# Patient Record
Sex: Female | Born: 1990 | Race: Black or African American | Hispanic: No | Marital: Married | Smoking: Never smoker
Health system: Southern US, Community
[De-identification: ages and names within clinical notes are randomized; demographics above are authoritative.]

---

## 2019-03-05 ENCOUNTER — Emergency Department (HOSPITAL_COMMUNITY)
Admission: EM | Admit: 2019-03-05 | Discharge: 2019-03-05 | Disposition: A | Discharge status: Home / Self Care | Payer: Medicaid - Out of State | Attending: Emergency Medicine | Admitting: Emergency Medicine

## 2019-03-05 ENCOUNTER — Encounter (HOSPITAL_COMMUNITY): Payer: Self-pay | Admitting: Emergency Medicine

## 2019-03-05 ENCOUNTER — Other Ambulatory Visit: Payer: Self-pay

## 2019-03-05 DIAGNOSIS — L239 Allergic contact dermatitis, unspecified cause: Secondary | ICD-10-CM | POA: Insufficient documentation

## 2019-03-05 DIAGNOSIS — R21 Rash and other nonspecific skin eruption: Secondary | ICD-10-CM | POA: Diagnosis present

## 2019-03-05 MED ORDER — HYDROXYZINE HCL 25 MG PO TABS
25.0000 mg | ORAL_TABLET | Freq: Once | ORAL | Status: AC
  Administered 2019-03-05: 25 mg via ORAL
  Filled 2019-03-05: qty 1

## 2019-03-05 MED ORDER — HYDROXYZINE HCL 25 MG PO TABS: 25.0000 mg | ORAL_TABLET | Freq: Four times a day (QID) | ORAL | 0 refills | Status: AC | PRN

## 2019-03-05 NOTE — ED Triage Notes (Signed)
Pt reports wore a new bra yesterday got from [place] without washing and having rash.

## 2019-03-05 NOTE — ED Provider Notes (Signed)
[facility]-EMERGENCY DEPT Provider Note   CSN: [id number] Arrival date & time: 03/05/19  1424     History   Chief Complaint Chief Complaint  Patient presents with  . Rash    HPI Alexa Allison is a 28 y.o. female.     Patient is a 28 year old female with no past medical history presenting to the emergency department for rash which began yesterday.  Patient reports that this began almost immediately after she put on a new bra that she had just opened from the package that was shipped in the mail.  Reports that it is very itchy and it got worse overnight.  Reports that she has the rash around her torso where her breasts are as well as her shoulders where the straps of the bra were.  Denies any fever, chills, nausea, vomiting, pain     History reviewed. No pertinent past medical history.  There are no active problems to display for this patient.      OB History   No obstetric history on file.      Home Medications    Prior to Admission medications   Medication Sig Start Date End Date Taking? Authorizing Provider  hydrOXYzine (ATARAX/VISTARIL) 25 MG tablet Take 1 tablet (25 mg total) by mouth every 6 (six) hours as needed for up to 7 days. 03/05/19 03/12/19  Arlyn Dunning, PA-C    Family History No family history on file.  Social History Social History   Tobacco Use  . Smoking status: Not on file  Substance Use Topics  . Alcohol use: Not on file  . Drug use: Not on file     Allergies   Patient has no known allergies.   Review of Systems Review of Systems  Constitutional: Negative for appetite change, chills and fever.  HENT: Negative for congestion and sore throat.   Eyes: Negative for redness.  Respiratory: Negative for shortness of breath.   Cardiovascular: Negative for chest pain.  Gastrointestinal: Negative for abdominal pain, nausea and vomiting.  Genitourinary: Negative for dysuria.  Musculoskeletal: Negative for arthralgias,  back pain and myalgias.  Skin: Positive for rash.  Allergic/Immunologic: Negative for immunocompromised state.  Neurological: Negative for dizziness, light-headedness and headaches.     Physical Exam Updated Vital Signs BP (!) 142/94   Pulse 95   Temp 98.3 F (36.8 C) (Oral)   Resp 17   LMP 02/14/2019   SpO2 100%   Physical Exam Vitals signs and nursing note reviewed.  Constitutional:      Appearance: Normal appearance.  HENT:     Head: Normocephalic.  Eyes:     Conjunctiva/sclera: Conjunctivae normal.  Pulmonary:     Effort: Pulmonary effort is normal.  Chest:       Comments: Patient has a scattered, hive-like appearing rash which is blanchable over the folds of the breast and shoulders.  This extends onto the upper extremities to the forearms, as well as the back. Skin:    General: Skin is dry.  Neurological:     Mental Status: She is alert.  Psychiatric:        Mood and Affect: Mood normal.      ED Treatments / Results  Labs (all labs ordered are listed, but only abnormal results are displayed) Labs Reviewed - No data to display  EKG None  Radiology No results found.  Procedures Procedures (including critical care time)  Medications Ordered in ED Medications  hydrOXYzine (ATARAX/VISTARIL) tablet 25 mg (has no  administration in time range)     Initial Impression / Assessment and Plan / ED Course  I have reviewed the triage vital signs and the nursing notes.  Pertinent labs & imaging results that were available during my care of the patient were reviewed by me and considered in my medical decision making (see chart for details).       Based on review of vitals, medical screening exam, lab work and/or imaging, there does not appear to be an acute, emergent etiology for the patient's symptoms. Counseled pt on good return precautions and encouraged both PCP and ED follow-up as needed.  Prior to discharge, I also discussed incidental imaging  findings with patient in detail and advised appropriate, recommended follow-up in detail.  Clinical Impression: 1. Allergic contact dermatitis, unspecified trigger     Disposition: Discharge  Prior to providing a prescription for a controlled substance, I independently reviewed the patient's recent prescription history on the [facility]. The patient had no recent or regular prescriptions and was deemed appropriate for a brief, less than 3 day prescription of narcotic for acute analgesia.  This note was prepared with assistance of Systems analyst. Occasional wrong-word or sound-a-like substitutions may have occurred due to the inherent limitations of voice recognition software.   Final Clinical Impressions(s) / ED Diagnoses   Final diagnoses:  Allergic contact dermatitis, unspecified trigger    ED Discharge Orders         Ordered    hydrOXYzine (ATARAX/VISTARIL) 25 MG tablet  Every 6 hours PRN     03/05/19 1530           Kristine Royal 03/05/19 1530    Lacretia Leigh, MD 03/07/19 1342

## 2019-03-05 NOTE — Discharge Instructions (Signed)
Patient not to wear this again.  Take medication as prescribed.  If the rash is persists or worsens please follow-up with dermatology.  If you have any new symptoms please return to the emergency department.

## 2020-10-12 ENCOUNTER — Telehealth: Payer: Self-pay

## 2020-10-12 NOTE — Telephone Encounter (Signed)
Name does not ring a bell. Please call and clarify who she is? Does she have a family member who I care for? Because I am not open to new patients.

## 2020-10-12 NOTE — Telephone Encounter (Signed)
Please advise 

## 2020-10-12 NOTE — Telephone Encounter (Signed)
patient stated that dr.andy told patient to call and get a new patient apt with her please advise ?

## 2020-10-13 NOTE — Telephone Encounter (Signed)
Unfortunately I am currently closed to new patients because my panel is full.  This could change in the future.

## 2020-10-13 NOTE — Telephone Encounter (Signed)
Patient notified

## 2020-10-13 NOTE — Telephone Encounter (Signed)
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 Mardelle Matte.

## 2020-10-13 NOTE — Telephone Encounter (Signed)
Please call patient and offer to est with another provider

## 2020-12-31 ENCOUNTER — Ambulatory Visit: Payer: PRIVATE HEALTH INSURANCE | Admitting: Physician Assistant

## 2020-12-31 ENCOUNTER — Emergency Department (HOSPITAL_COMMUNITY): Payer: Managed Care, Other (non HMO)

## 2020-12-31 ENCOUNTER — Encounter (HOSPITAL_COMMUNITY): Payer: Self-pay

## 2020-12-31 ENCOUNTER — Other Ambulatory Visit: Payer: Self-pay

## 2020-12-31 ENCOUNTER — Emergency Department (HOSPITAL_COMMUNITY)
Admission: EM | Admit: 2020-12-31 | Discharge: 2020-12-31 | Disposition: A | Discharge status: Home / Self Care | Payer: Managed Care, Other (non HMO) | Attending: Emergency Medicine | Admitting: Emergency Medicine

## 2020-12-31 DIAGNOSIS — S92421A Displaced fracture of distal phalanx of right great toe, initial encounter for closed fracture: Secondary | ICD-10-CM | POA: Insufficient documentation

## 2020-12-31 DIAGNOSIS — W208XXA Other cause of strike by thrown, projected or falling object, initial encounter: Secondary | ICD-10-CM | POA: Diagnosis not present

## 2020-12-31 DIAGNOSIS — S99921A Unspecified injury of right foot, initial encounter: Secondary | ICD-10-CM | POA: Diagnosis present

## 2020-12-31 DIAGNOSIS — M79673 Pain in unspecified foot: Secondary | ICD-10-CM

## 2020-12-31 MED ORDER — IBUPROFEN 400 MG PO TABS
600.0000 mg | ORAL_TABLET | Freq: Once | ORAL | Status: AC
  Administered 2020-12-31: 600 mg via ORAL
  Filled 2020-12-31: qty 1

## 2020-12-31 NOTE — ED Provider Notes (Signed)
[facility] EMERGENCY DEPARTMENT Provider Note   CSN: [id number] Arrival date & time: 12/31/20  0450     History Chief Complaint  Patient presents with   Foot Injury    Alexa Allison is a 30 y.o. female.  HPI 30 year old female presents with right great toe injury.  Yesterday afternoon she accidentally dropped a weight on her foot.  Has taken Tylenol.  She has pain mostly with dorsiflexion of the toe.  No numbness.  She has been able to ambulate.  History reviewed. No pertinent past medical history.  There are no problems to display for this patient.   History reviewed. No pertinent surgical history.   OB History   No obstetric history on file.     History reviewed. No pertinent family history.  Social History   Tobacco Use   Smoking status: Never   Smokeless tobacco: Never  Vaping Use   Vaping Use: Never used  Substance Use Topics   Alcohol use: Yes    Comment: occasionally   Drug use: Never    Home Medications Prior to Admission medications   Not on File    Allergies    Patient has no known allergies.  Review of Systems   Review of Systems  Musculoskeletal:  Positive for arthralgias.  Neurological:  Negative for weakness and numbness.   Physical Exam Updated Vital Signs BP (!) 140/109 (BP Location: Right Arm)   Pulse (!) 103   Temp 98.5 F (36.9 C) (Oral)   Resp 18   Ht 5\' 4"  (1.626 m)   Wt 86.2 kg   SpO2 99%   BMI 32.61 kg/m   Physical Exam Vitals and nursing note reviewed.  Constitutional:      Appearance: She is well-developed.  HENT:     Head: Normocephalic and atraumatic.     Right Ear: External ear normal.     Left Ear: External ear normal.     Nose: Nose normal.  Eyes:     General:        Right eye: No discharge.        Left eye: No discharge.  Cardiovascular:     Rate and Rhythm: Normal rate and regular rhythm.     Pulses:          Dorsalis pedis pulses are 2+ on the right side.  Pulmonary:     Effort:  Pulmonary effort is normal.  Abdominal:     General: There is no distension.  Musculoskeletal:     Right foot: Decreased range of motion (great toe). Tenderness present.       Feet:  Skin:    General: Skin is warm and dry.  Neurological:     Mental Status: She is alert.  Psychiatric:        Mood and Affect: Mood is not anxious.    ED Results / Procedures / Treatments   Labs (all labs ordered are listed, but only abnormal results are displayed) Labs Reviewed - No data to display  EKG None  Radiology DG Foot Complete Right  Result Date: 12/31/2020 CLINICAL DATA:  30 year old female with history of trauma to the right great toe (dropped a 45 pound weight onto the right great toe). EXAM: RIGHT FOOT COMPLETE - 3+ VIEW COMPARISON:  No priors. FINDINGS: Best appreciated on the lateral projection is a mildly displaced intra-articular fracture through the base of the first distal phalanx, with the distal aspect of the fracture demonstrating approximately 3 mm of dorsal displacement (  although the proximal intra-articular portion of the fracture does not appear displaced). Overlying soft tissues are mildly swollen. There is no evidence of arthropathy or other focal bone abnormality. Soft tissues are unremarkable. IMPRESSION: 1. Acute minimally displaced intra-articular fracture through the base of the distal phalanx of the right great toe, as above. Electronically Signed   By: Trudie Reed M.D.   On: 12/31/2020 05:35    Procedures Procedures   Medications Ordered in ED Medications  ibuprofen (ADVIL) tablet 600 mg (has no administration in time range)    ED Course  I have reviewed the triage vital signs and the nursing notes.  Pertinent labs & imaging results that were available during my care of the patient were reviewed by me and considered in my medical decision making (see chart for details).    MDM Rules/Calculators/A&P                           Patient is neurovascularly  intact.  She does not have a subungual hematoma.  She has a minimally displaced fracture and will have her follow-up with orthopedics.  She declines anything stronger than ibuprofen/Tylenol for pain at home.  Will buddy tape. Final Clinical Impression(s) / ED Diagnoses Final diagnoses:  Foot pain  Closed displaced fracture of distal phalanx of right great toe, initial encounter    Rx / DC Orders ED Discharge Orders     None        Pricilla Loveless, MD 12/31/20 [phone]

## 2020-12-31 NOTE — ED Triage Notes (Signed)
Pt dropped a 45lb weight onto right great toe. Since then pain has increased. Pt states she has applied ice and taken tylenol w/o relief. Pt able to move toes, has pain.

## 2021-06-01 ENCOUNTER — Encounter (HOSPITAL_COMMUNITY): Payer: Self-pay | Admitting: Emergency Medicine

## 2021-06-01 ENCOUNTER — Emergency Department (HOSPITAL_COMMUNITY): Payer: Managed Care, Other (non HMO)

## 2021-06-01 ENCOUNTER — Emergency Department (HOSPITAL_COMMUNITY)
Admission: EM | Admit: 2021-06-01 | Discharge: 2021-06-01 | Disposition: A | Discharge status: Home / Self Care | Payer: Managed Care, Other (non HMO) | Attending: Emergency Medicine | Admitting: Emergency Medicine

## 2021-06-01 DIAGNOSIS — M25511 Pain in right shoulder: Secondary | ICD-10-CM | POA: Diagnosis not present

## 2021-06-01 DIAGNOSIS — Y92410 Unspecified street and highway as the place of occurrence of the external cause: Secondary | ICD-10-CM | POA: Diagnosis not present

## 2021-06-01 DIAGNOSIS — M545 Low back pain, unspecified: Secondary | ICD-10-CM | POA: Diagnosis present

## 2021-06-01 DIAGNOSIS — Z79899 Other long term (current) drug therapy: Secondary | ICD-10-CM | POA: Diagnosis not present

## 2021-06-01 DIAGNOSIS — M7918 Myalgia, other site: Secondary | ICD-10-CM

## 2021-06-01 DIAGNOSIS — S39012A Strain of muscle, fascia and tendon of lower back, initial encounter: Secondary | ICD-10-CM | POA: Insufficient documentation

## 2021-06-01 LAB — I-STAT BETA HCG BLOOD, ED (MC, WL, AP ONLY): I-stat hCG, quantitative: <5 m[IU]/mL (ref <5)

## 2021-06-01 MED ORDER — CYCLOBENZAPRINE HCL 10 MG PO TABS: 10.0000 mg | ORAL_TABLET | Freq: Two times a day (BID) | ORAL | 0 refills | Status: AC | PRN
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
## 2021-06-01 NOTE — Discharge Instructions (Signed)
Recommend 600 mg ibuprofen every 8 hours as needed for pain.  Recommend 1000 mg of Tylenol every 6 hours as needed for pain.  Take Flexeril as prescribed for muscle spasms.  This medication is sedating so do not use while driving or doing any other dangerous activities as discussed.

## 2022-06-29 ENCOUNTER — Emergency Department (HOSPITAL_COMMUNITY)
Admission: EM | Admit: 2022-06-29 | Discharge: 2022-06-30 | Disposition: A | Discharge status: Home / Self Care | Payer: Managed Care, Other (non HMO) | Attending: Emergency Medicine | Admitting: Emergency Medicine

## 2022-06-29 ENCOUNTER — Other Ambulatory Visit: Payer: Self-pay

## 2022-06-29 DIAGNOSIS — Z91010 Allergy to peanuts: Secondary | ICD-10-CM | POA: Diagnosis not present

## 2022-06-29 DIAGNOSIS — N939 Abnormal uterine and vaginal bleeding, unspecified: Secondary | ICD-10-CM | POA: Insufficient documentation

## 2022-06-29 DIAGNOSIS — D649 Anemia, unspecified: Secondary | ICD-10-CM | POA: Insufficient documentation

## 2022-06-29 LAB — CBC
HCT: 35.6 % — ABNORMAL LOW (ref 36.0–46.0)
Hemoglobin: 11.7 g/dL — ABNORMAL LOW (ref 12.0–15.0)
MCH: 28.1 pg (ref 26.0–34.0)
MCHC: 32.9 g/dL (ref 30.0–36.0)
MCV: 85.4 fL (ref 80.0–100.0)
Platelets: 299 10*3/uL (ref 150–400)
RBC: 4.17 MIL/uL (ref 3.87–5.11)
RDW: 12.7 % (ref 11.5–15.5)
WBC: 5.8 10*3/uL (ref 4.0–10.5)
nRBC: 0 % (ref 0.0–0.2)

## 2022-06-29 LAB — URINALYSIS, MICROSCOPIC (REFLEX): RBC / HPF: >50 RBC/hpf (ref 0–5)

## 2022-06-29 LAB — BASIC METABOLIC PANEL
Anion gap: 6 (ref 5–15)
BUN: 12 mg/dL (ref 6–20)
CO2: 26 mmol/L (ref 22–32)
Calcium: 9.1 mg/dL (ref 8.9–10.3)
Chloride: 105 mmol/L (ref 98–111)
Creatinine, Ser: 0.63 mg/dL (ref 0.44–1.00)
GFR, Estimated: >60 mL/min (ref >60)
Glucose, Bld: 114 mg/dL — ABNORMAL HIGH (ref 70–99)
Potassium: 3.4 mmol/L — ABNORMAL LOW (ref 3.5–5.1)
Sodium: 137 mmol/L (ref 135–145)

## 2022-06-29 LAB — URINALYSIS, ROUTINE W REFLEX MICROSCOPIC

## 2022-06-29 LAB — PREGNANCY, URINE: Preg Test, Ur: NEGATIVE

## 2022-06-29 NOTE — ED Provider Triage Note (Signed)
Emergency Medicine Provider Triage Evaluation Note  Alexa Allison , a 32 y.o. female  was evaluated in triage.  Pt complains of abnormal uterine bleeding.  Patient reports that her period ended 2 weeks ago.  Patient states that beginning yesterday she developed vaginal bleeding.  Patient states she is going through 3-4 pads per hour.  Patient also endorsing dysuria.  Patient denies vaginal discharge, flank pain, lightheadedness, dizziness, weakness, shortness of breath.  Patient reports she has IUD in place, has never experienced abnormal uterine bleeding before.  The patient reports that she was recently sexually active with her fianc, denies any dyspareunia.  Review of Systems  Positive:  Negative:   Physical Exam  BP (!) 139/91 (BP Location: Right Arm)   Pulse 95   Temp 98.9 F (37.2 C) (Oral)   Resp 16   LMP 06/28/2022 (Exact Date)   SpO2 99%  Gen:   Awake, no distress   Resp:  Normal effort  MSK:   Moves extremities without difficulty  Other:    Medical Decision Making  Medically screening exam initiated at 5:07 PM.  Appropriate orders placed.  Alexa Allison was informed that the remainder of the evaluation will be completed by another provider, this initial triage assessment does not replace that evaluation, and the importance of remaining in the ED until their evaluation is complete.     Azucena Cecil, PA-C 06/29/22 1708

## 2022-06-29 NOTE — ED Triage Notes (Signed)
Pt reports a normal period two weeks ago and then began having vaginal bleeding yesterday again. Bleeding heavier than usual but pain less severe. Has IUD, normally does not have irregular periods.

## 2022-06-29 NOTE — ED Provider Notes (Signed)
[facility] Provider Note  CSN: [id number] Arrival date & time: 06/29/22 1646  Chief Complaint(s) Vaginal Bleeding  HPI Alexa Allison is a 32 y.o. female who presents to the emergency department with abnormal vaginal bleeding.  She reports that her previous menstrual cycle was 2 weeks ago.  She began bleeding 2 days ago stating that she went through a pad an hour yesterday.  Today her bleeding has slowed down and has only changed her pad 4 times.  She endorses abdominal discomfort and cramping but denies any current pain at this time.  She denies any urinary symptoms.  No other physical complaints.  Patient is monogamous with her partner.  Denied any vaginal discharge or concern for STD.  She did report having an IUD in place for 5 to 6 years.  The history is provided by the patient.    Past Medical History No past medical history on file. There are no problems to display for this patient.  Home Medication(s) Prior to Admission medications   Medication Sig Start Date End Date Taking? Authorizing Provider  cyclobenzaprine (FLEXERIL) 10 MG tablet Take 1 tablet (10 mg total) by mouth 2 (two) times daily as needed for muscle spasms. 06/01/21   Curatolo, Adam, DO                                                                                                                                    Allergies Peanut-containing drug products  Review of Systems Review of Systems As noted in HPI  Physical Exam Vital Signs  I have reviewed the triage vital signs BP (!) 133/94   Pulse 78   Temp 98.5 F (36.9 C)   Resp 16   LMP 06/28/2022 (Exact Date)   SpO2 99%   Physical Exam Vitals reviewed.  Constitutional:      General: She is not in acute distress.    Appearance: She is well-developed. She is not diaphoretic.  HENT:     Head: Normocephalic and atraumatic.     Right Ear: External ear normal.     Left Ear: External ear normal.     Nose:  Nose normal.  Eyes:     General: No scleral icterus.    Conjunctiva/sclera: Conjunctivae normal.  Neck:     Trachea: Phonation normal.  Cardiovascular:     Rate and Rhythm: Normal rate and regular rhythm.  Pulmonary:     Effort: Pulmonary effort is normal. No respiratory distress.     Breath sounds: No stridor.  Abdominal:     General: There is no distension.     Tenderness: There is no abdominal tenderness.  Musculoskeletal:        General: Normal range of motion.     Cervical back: Normal range of motion.  Neurological:     Mental Status: She is alert and oriented to person, place, and time.  Psychiatric:  Behavior: Behavior normal.     ED Results and Treatments Labs (all labs ordered are listed, but only abnormal results are displayed) Labs Reviewed  URINALYSIS, ROUTINE W REFLEX MICROSCOPIC - Abnormal; Notable for the following components:      Result Value   Color, Urine BROWN (*)    APPearance TURBID (*)    Glucose, UA   (*)    Value: TEST NOT REPORTED DUE TO COLOR INTERFERENCE OF URINE PIGMENT   Hgb urine dipstick   (*)    Value: TEST NOT REPORTED DUE TO COLOR INTERFERENCE OF URINE PIGMENT   Bilirubin Urine   (*)    Value: TEST NOT REPORTED DUE TO COLOR INTERFERENCE OF URINE PIGMENT   Ketones, ur   (*)    Value: TEST NOT REPORTED DUE TO COLOR INTERFERENCE OF URINE PIGMENT   Protein, ur   (*)    Value: TEST NOT REPORTED DUE TO COLOR INTERFERENCE OF URINE PIGMENT   Nitrite   (*)    Value: TEST NOT REPORTED DUE TO COLOR INTERFERENCE OF URINE PIGMENT   Leukocytes,Ua   (*)    Value: TEST NOT REPORTED DUE TO COLOR INTERFERENCE OF URINE PIGMENT   All other components within normal limits  CBC - Abnormal; Notable for the following components:   Hemoglobin 11.7 (*)    HCT 35.6 (*)    All other components within normal limits  BASIC METABOLIC PANEL - Abnormal; Notable for the following components:   Potassium 3.4 (*)    Glucose, Bld 114 (*)    All other  components within normal limits  URINALYSIS, MICROSCOPIC (REFLEX) - Abnormal; Notable for the following components:   Bacteria, UA FEW (*)    All other components within normal limits  PREGNANCY, URINE                                                                                                                         EKG  EKG Interpretation  Date/Time:    Ventricular Rate:    PR Interval:    QRS Duration:   QT Interval:    QTC Calculation:   R Axis:     Text Interpretation:         Radiology No results found.  Medications Ordered in ED Medications - No data to display  Procedures Procedures  (including critical care time)  Medical Decision Making / ED Course   Medical Decision Making Amount and/or Complexity of Data Reviewed Labs: ordered. Decision-making details documented in ED Course.   This patient presents to the ED for concern of vaginal bleeding, this involves an extensive number of treatment options, and is a complaint that carries with it a high risk of complications and morbidity. The differential diagnosis includes but not limited to abnormal uterine bleeding, pregnancy related process, STD buccal feel this is less likely.  Will need to assess for anemia as well.  CBC without leukocytosis.  Mild anemia with a hemoglobin of 11.7.  No priors for comparison. Metabolic panel without significant electrolyte derangements or renal sufficiency. UPT negative. UA is contaminated with blood.  Given her lack of urinary symptoms, low suspicion for UTI. Patient offered STD check in but with shared decision making, patient opted to defer for now.  Bleeding is likely secondary to lifespan of her IUD.  Will provide patient with contact information for OB/GYN        Final Clinical Impression(s) / ED Diagnoses Final diagnoses:  Abnormal  vaginal bleeding   The patient appears reasonably screened and/or stabilized for discharge and I doubt any other medical condition or other [facility] requiring further screening, evaluation, or treatment in the ED at this time. I have discussed the findings, Dx and Tx plan with the patient/family who expressed understanding and agree(s) with the plan. Discharge instructions discussed at length. The patient/family was given strict return precautions who verbalized understanding of the instructions. No further questions at time of discharge.  Disposition: Discharge  Condition: Good  ED Discharge Orders     None        Follow Up: [facility] [place] [phone] Call  to schedule follow up in 1-2 weeks           This chart was dictated using voice recognition software.  Despite best efforts to proofread,  errors can occur which can change the documentation meaning.    Fatima Blank, MD 06/30/22 0001
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
## 2024-02-05 ENCOUNTER — Other Ambulatory Visit: Payer: Self-pay | Admitting: Family Medicine

## 2024-02-05 DIAGNOSIS — Z1231 Encounter for screening mammogram for malignant neoplasm of breast: Secondary | ICD-10-CM

## 2024-02-14 ENCOUNTER — Ambulatory Visit
Admission: RE | Admit: 2024-02-14 | Discharge: 2024-02-14 | Disposition: A | Discharge status: Home / Self Care | Source: Ambulatory Visit | Attending: Family Medicine | Admitting: Family Medicine

## 2024-02-14 DIAGNOSIS — Z1231 Encounter for screening mammogram for malignant neoplasm of breast: Secondary | ICD-10-CM
